# Patient Record
Sex: Male | Born: 1965 | Race: White | Hispanic: No | Marital: Married
Health system: Southern US, Community
[De-identification: ages and names within clinical notes are randomized; demographics above are authoritative.]

---

## 2014-09-14 ENCOUNTER — Ambulatory Visit: Payer: Self-pay | Admitting: Unknown Physician Specialty

## 2016-01-05 IMAGING — US ABDOMEN ULTRASOUND
1 series · 14 of 25 positions shown · non-contrast
Comparison: None.

CLINICAL DATA: Generalized abdominal pain and elevated LFTs.

EXAM:
ULTRASOUND ABDOMEN COMPLETE

[Series 1: abdomen ultrasound · 0.23mm/px · 14 of 88 slices shown]
[im 1/88]
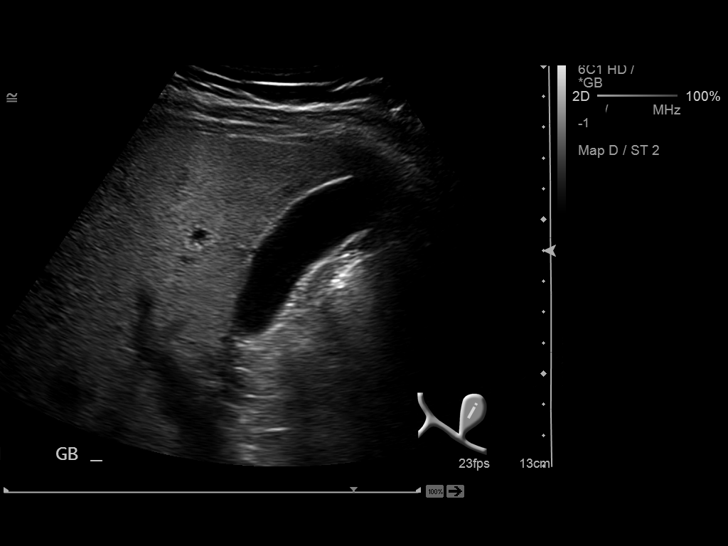
[im 8/88]
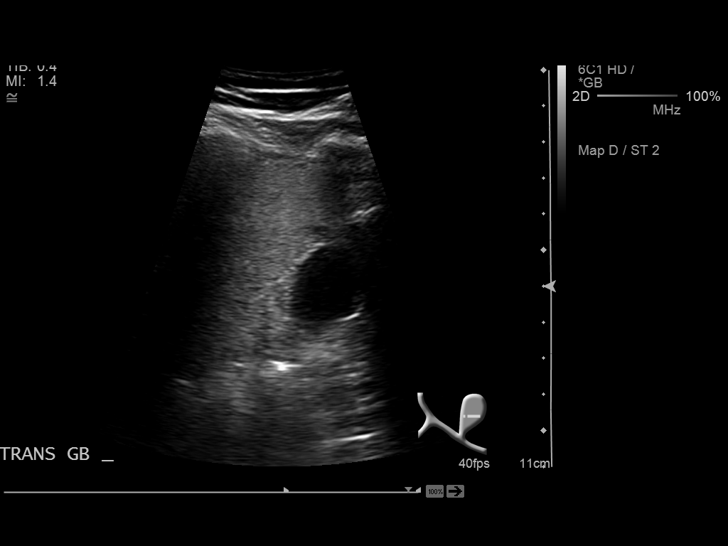
[im 15/88]
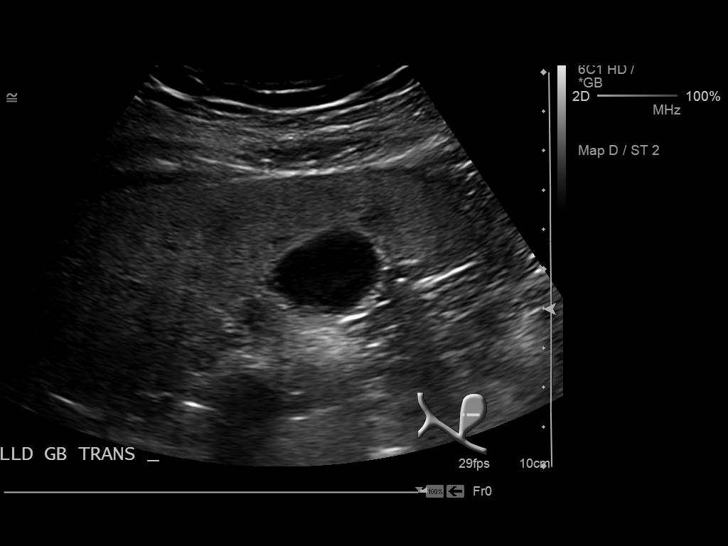
[im 22/88]
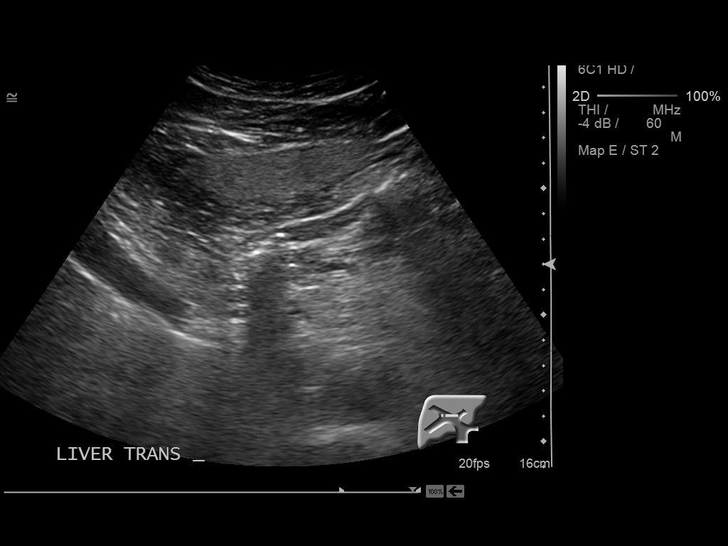
[im 30/88]
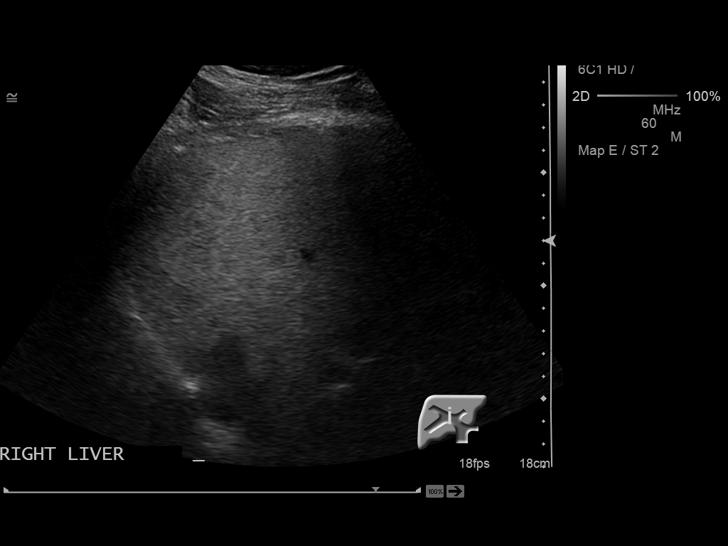
[im 33/88]
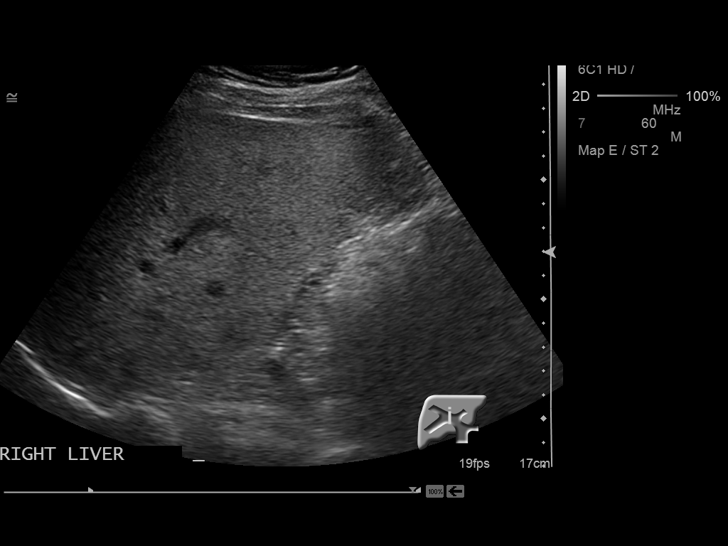
[im 40/88]
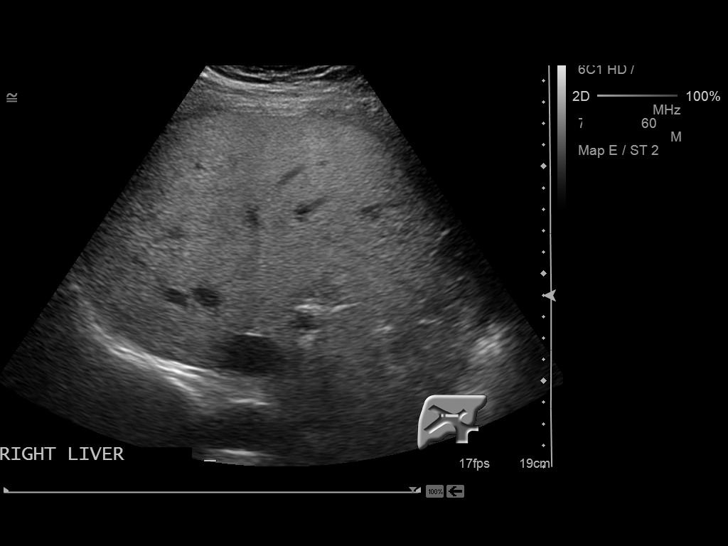
[im 48/88]
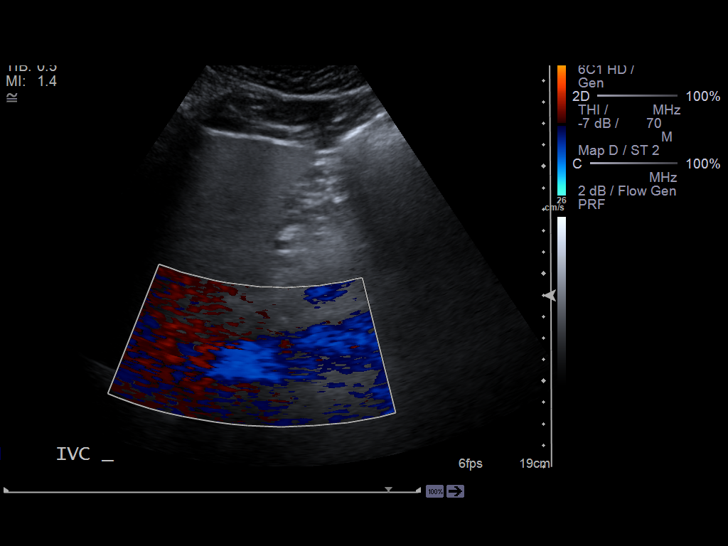
[im 55/88]
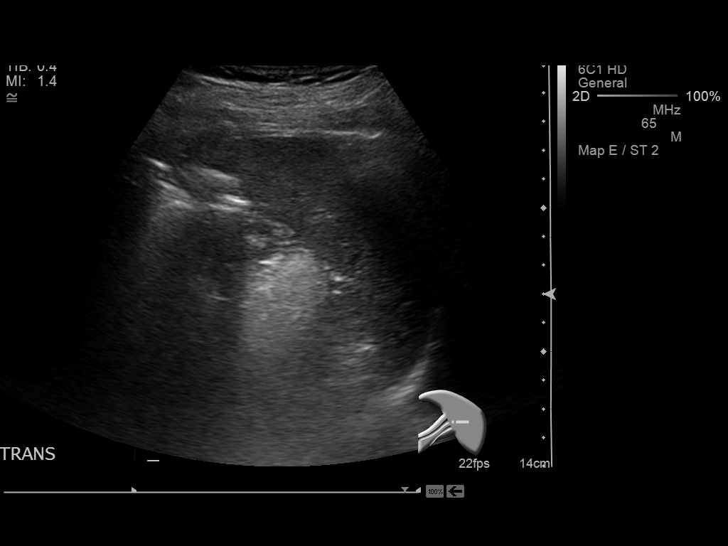
[im 59/88]
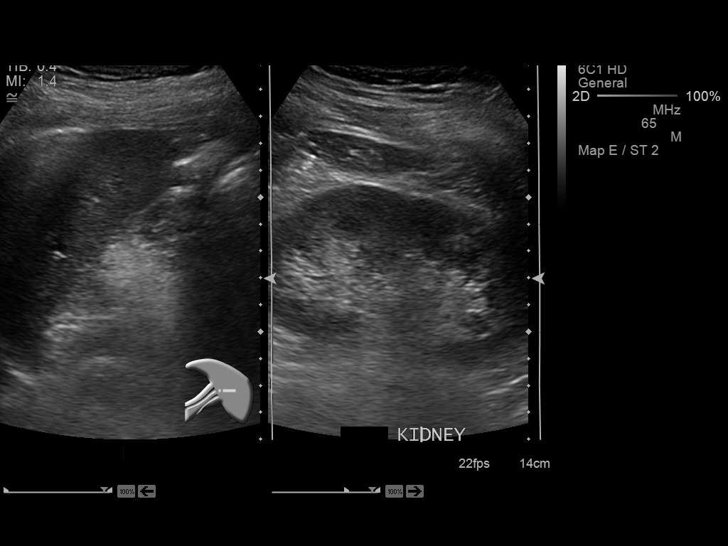
[im 66/88]
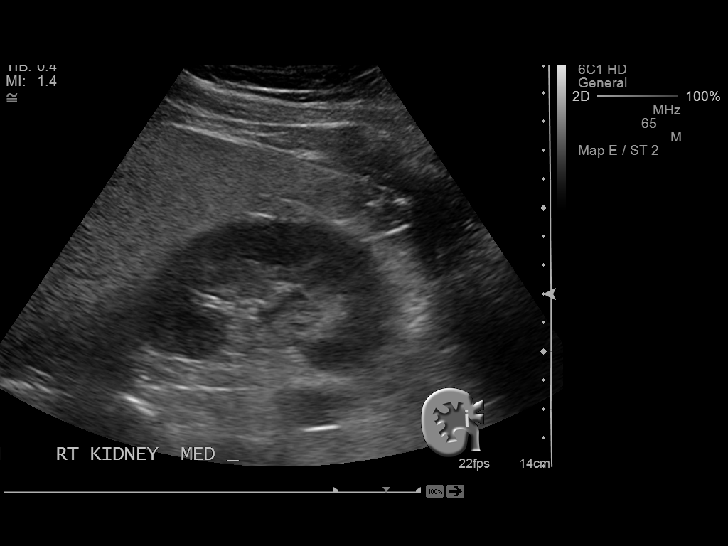
[im 73/88]
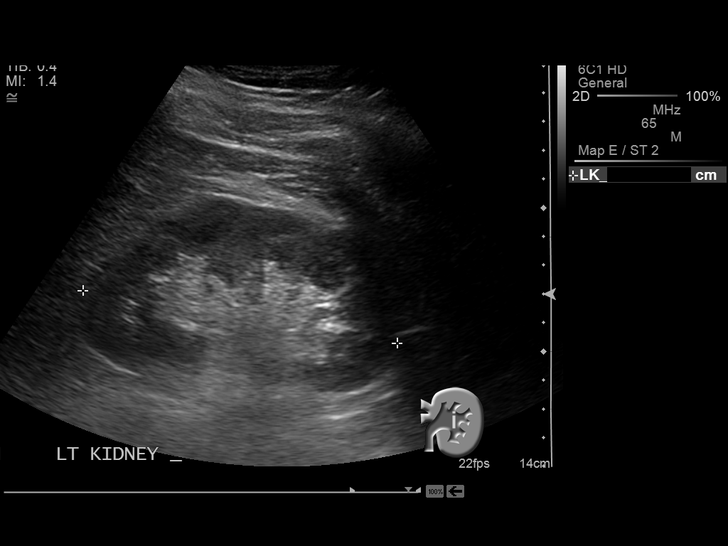
[im 80/88]
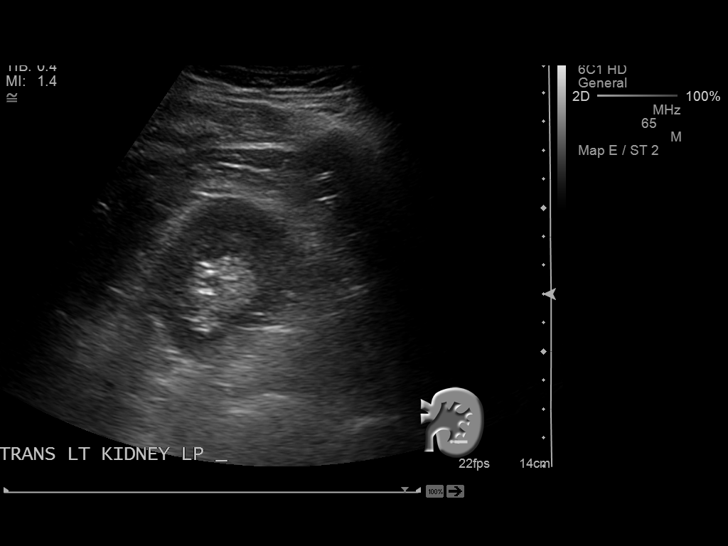
[im 88/88]
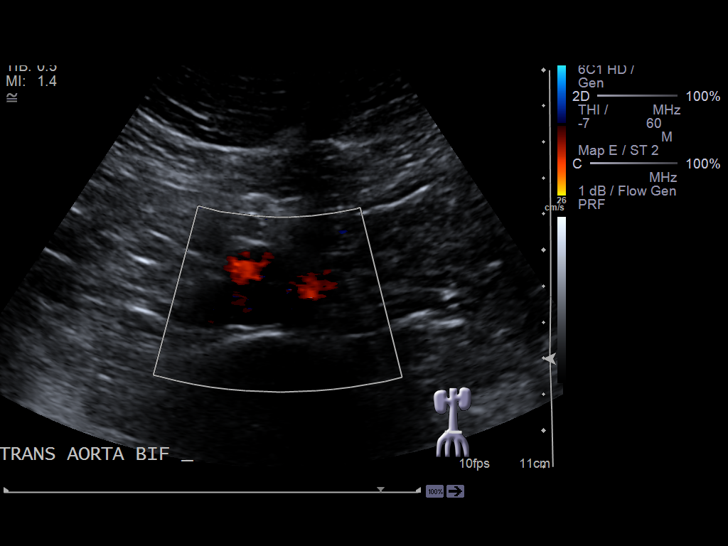

[14 of 25 positions shown; findings below may reference images not displayed]

FINDINGS: Gallbladder: Gallbladder has a normal appearance. Gallbladder wall
is 2.3 mm, within normal limits. No stones or pericholecystic fluid.
No sonographic Murphy's sign.

Common bile duct: Diameter: 3.4 mm

Liver: The liver is echogenic. There is attenuation of the
ultrasound wave, poor visualization of the internal hepatic
architecture, and loss of definition of the diaphragm. No focal
lesions identified.

IVC: No abnormality visualized.

Pancreas: Visualized portion unremarkable.

Spleen: Size and appearance within normal limits.

Right Kidney: Length: 11.1 cm. Echogenicity within normal limits. No
mass or hydronephrosis visualized.

Left Kidney: Length: 11.1 cm. Echogenicity within normal limits. No
mass or hydronephrosis visualized.

Abdominal aorta: No aneurysm visualized.

Other findings: None.
IMPRESSION: 1. Fatty liver.
2.  No evidence for acute  abnormality.

## 2019-03-29 ENCOUNTER — Other Ambulatory Visit: Payer: Self-pay

## 2019-03-29 DIAGNOSIS — Z20822 Contact with and (suspected) exposure to covid-19: Secondary | ICD-10-CM

## 2019-03-30 ENCOUNTER — Ambulatory Visit: Payer: Self-pay | Admitting: *Deleted

## 2019-03-30 LAB — NOVEL CORONAVIRUS, NAA: SARS-CoV-2, NAA: DETECTED — AB

## 2019-03-30 NOTE — Telephone Encounter (Signed)
Contacted pt regarding COVID results; the states that he just spoke with another nurse regarding this issue.  Reason for Disposition . Caller has already spoken with another triager and has no further questions.  Answer Assessment - Initial Assessment Questions 1. REASON FOR CALL or QUESTION: "What is your reason for calling today?" or "How can I best help you?" or "What question do you have that I can help answer?"  Protocols used: NO CONTACT OR DUPLICATE CONTACT CALL-A-AH, INFORMATION ONLY CALL - NO TRIAGE-A-AH

## 2019-06-16 ENCOUNTER — Other Ambulatory Visit: Payer: Self-pay | Admitting: General Surgery

## 2019-06-16 DIAGNOSIS — R1031 Right lower quadrant pain: Secondary | ICD-10-CM

## 2019-06-29 ENCOUNTER — Ambulatory Visit
Admission: RE | Admit: 2019-06-29 | Discharge: 2019-06-29 | Disposition: A | Discharge status: Home / Self Care | Payer: Self-pay | Source: Ambulatory Visit | Attending: General Surgery | Admitting: General Surgery

## 2019-06-29 ENCOUNTER — Other Ambulatory Visit: Payer: Self-pay

## 2019-06-29 DIAGNOSIS — R1031 Right lower quadrant pain: Secondary | ICD-10-CM | POA: Insufficient documentation

## 2019-10-31 ENCOUNTER — Ambulatory Visit: Payer: PRIVATE HEALTH INSURANCE | Attending: Internal Medicine

## 2019-10-31 DIAGNOSIS — Z23 Encounter for immunization: Secondary | ICD-10-CM

## 2019-10-31 NOTE — Progress Notes (Signed)
   Covid-19 Vaccination Clinic  Name:  Randall Castillo    MRN: [id number] DOB: February 27, 1966  10/31/2019  Mr. [name] was observed post Covid-19 immunization for 15 minutes without incident. He was provided with Vaccine Information Sheet and instruction to access the V-Safe system.   Mr. [name] was instructed to call 911 with any severe reactions post vaccine: Marland Kitchen Difficulty breathing  . Swelling of face and throat  . A fast heartbeat  . A bad rash all over body  . Dizziness and weakness   Immunizations Administered    Name Date Dose VIS Date Route   Pfizer COVID-19 Vaccine 10/31/2019 11:25 AM 0.3 mL 07/08/2019 Intramuscular   Manufacturer: [employer], [employer]   Lot: [phone] [id number]   NDC: [address]-1

## 2019-11-22 ENCOUNTER — Ambulatory Visit: Payer: PRIVATE HEALTH INSURANCE | Attending: Internal Medicine

## 2019-11-22 DIAGNOSIS — Z23 Encounter for immunization: Secondary | ICD-10-CM

## 2019-11-22 NOTE — Progress Notes (Signed)
   Covid-19 Vaccination Clinic  Name:  Randall Castillo    MRN: [id number] DOB: 06/28/1966  11/22/2019  [name] was observed post Covid-19 immunization for 15 minutes without incident. He was provided with Vaccine Information Sheet and instruction to access the V-Safe system.   [name] was instructed to call 911 with any severe reactions post vaccine: Marland Kitchen Difficulty breathing  . Swelling of face and throat  . A fast heartbeat  . A bad rash all over body  . Dizziness and weakness   Immunizations Administered    Name Date Dose VIS Date Route   Pfizer COVID-19 Vaccine 11/22/2019  1:50 PM 0.3 mL 09/21/2018 Intramuscular   Manufacturer: [employer], [employer]   Lot: [id number]   NDC: [address]-0

## 2020-10-19 IMAGING — CT CT PELVIS W/O CM
2 of 3 series · 17 of 46 positions shown, 19 images · non-contrast
Comparison: Abdominal ultrasound dated 09/14/2014.

CLINICAL DATA: 53-year-old male with right groin pain.

EXAM:
CT PELVIS WITHOUT CONTRAST
TECHNIQUE: Multidetector CT imaging of the pelvis was performed following the
standard protocol without intravenous contrast.

[Series 2: axials routine abdomen pelvis without 5.00 · axial · non-contrast · 0.72mm/px · z∈[-1557,-1307]mm · 14 of 58 slices shown, 16 images]
[im 4/58  soft-tissue]
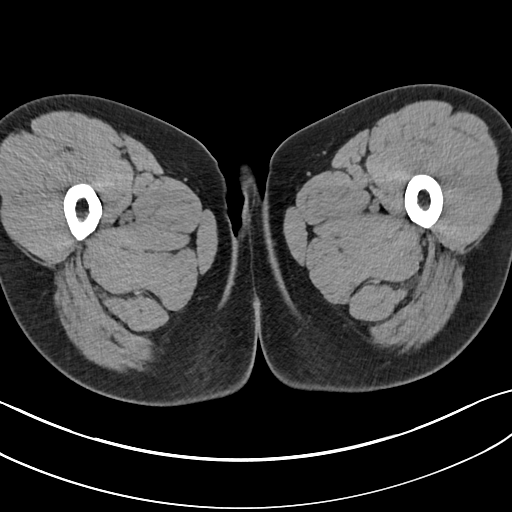
[im 4/58  bone]
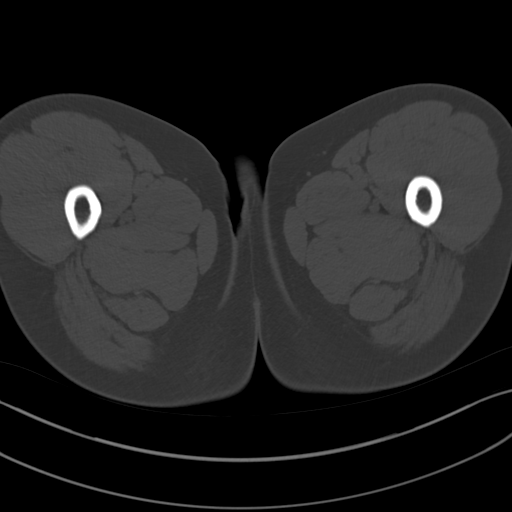
[im 8/58  soft-tissue]
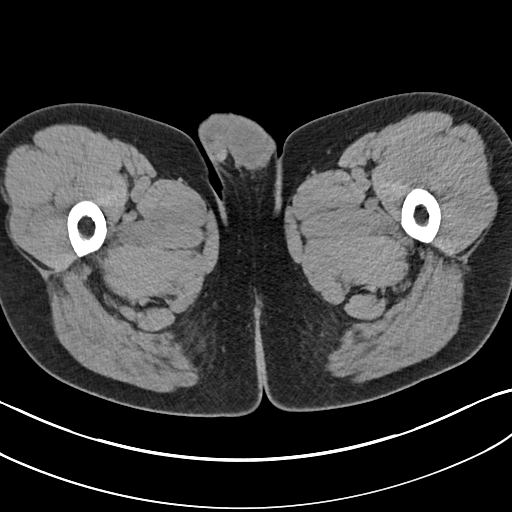
[im 12/58  soft-tissue]
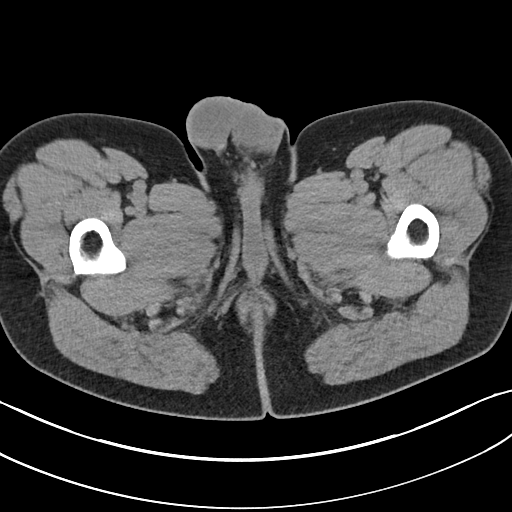
[im 15/58  soft-tissue]
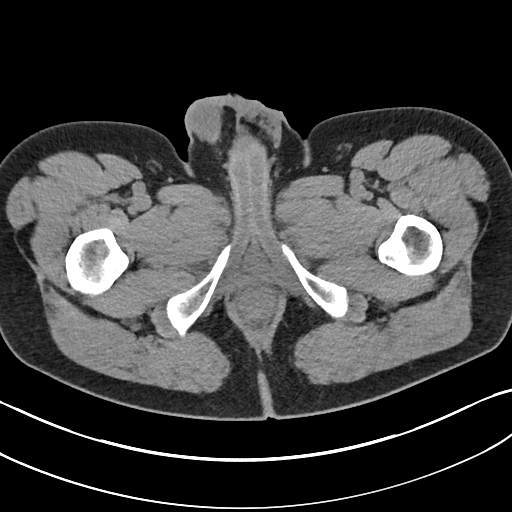
[im 19/58  soft-tissue]
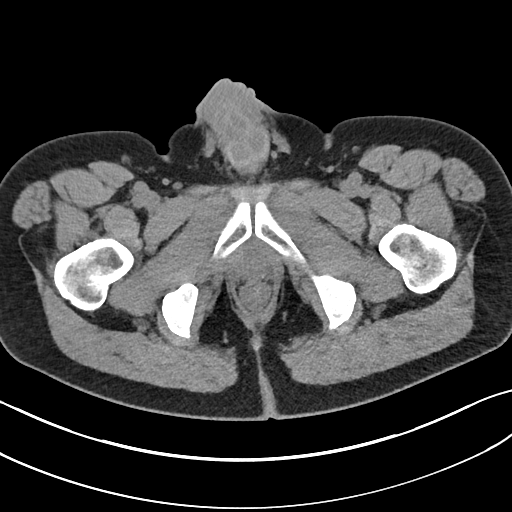
[im 23/58  soft-tissue]
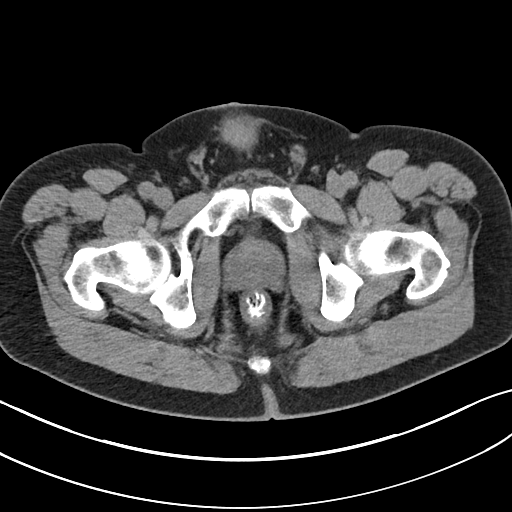
[im 26/58  soft-tissue]
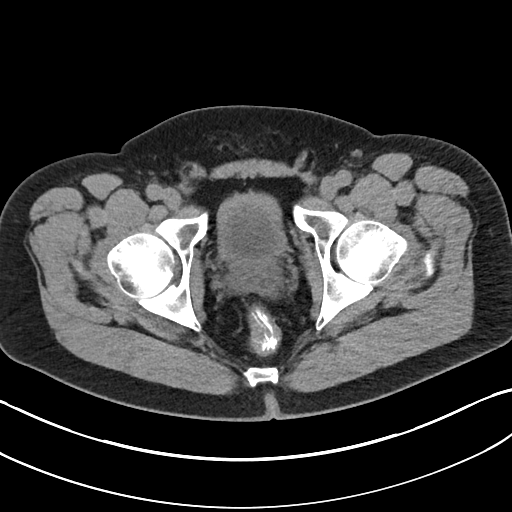
[im 32/58  soft-tissue]
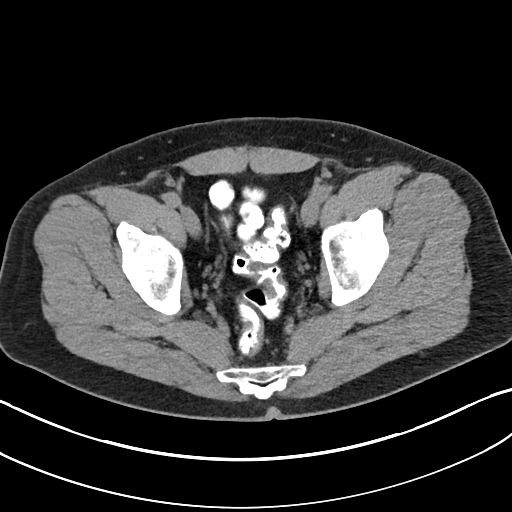
[im 35/58  soft-tissue]
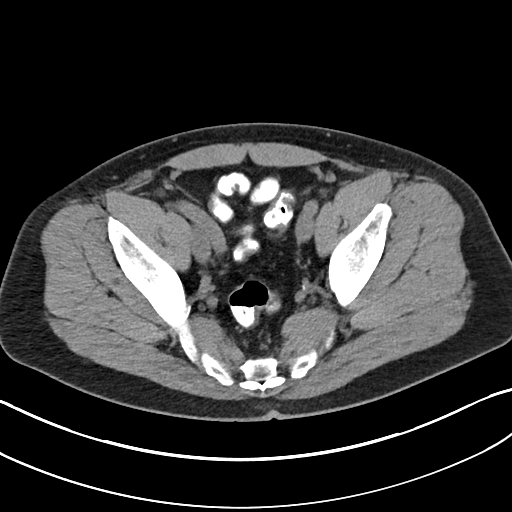
[im 35/58  bone]
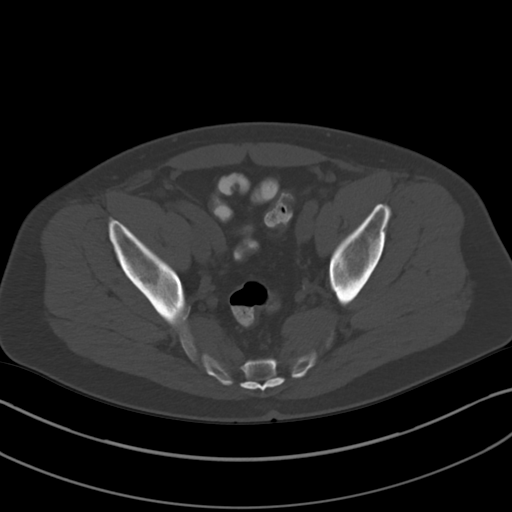
[im 39/58  soft-tissue]
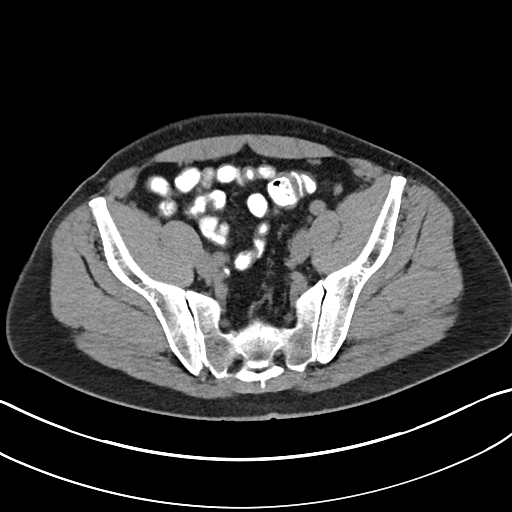
[im 43/58  soft-tissue]
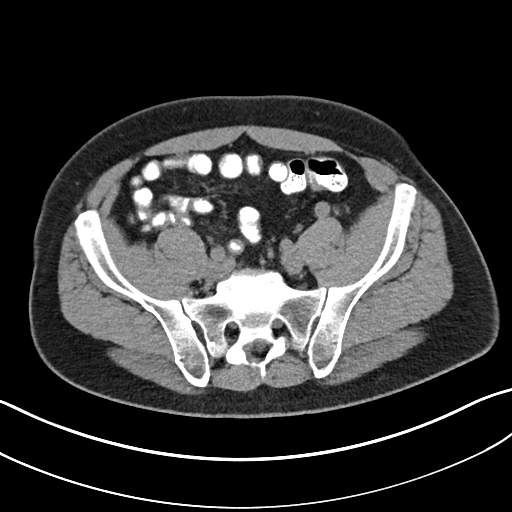
[im 46/58  soft-tissue]
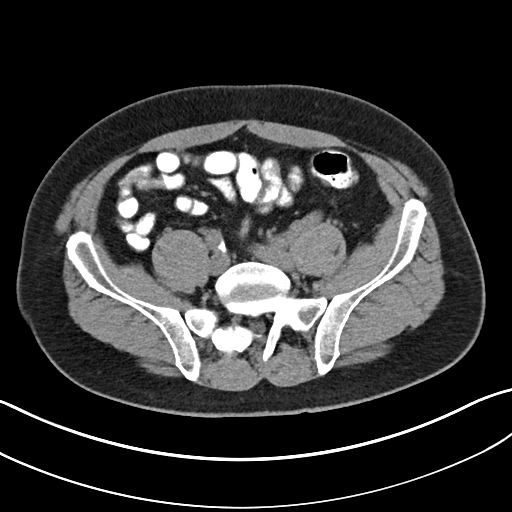
[im 50/58  soft-tissue]
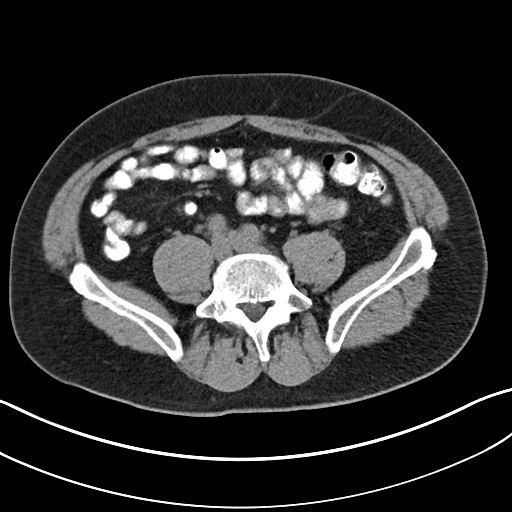
[im 54/58  soft-tissue]
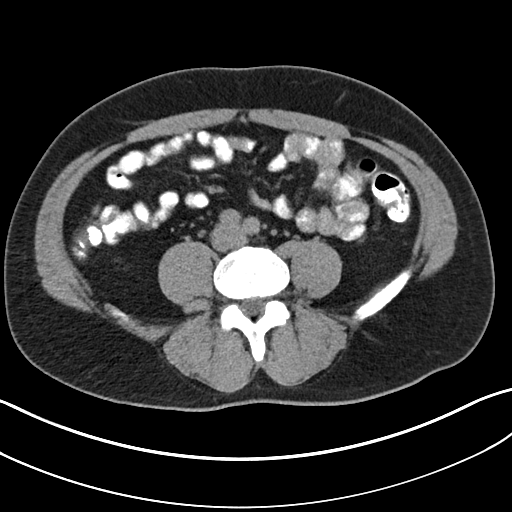

[Series 3: coronals routine abdomen pelvis without 2.00 cor · coronal · non-contrast · 0.57mm/px · 3 of 130 slices shown]
[im 44/130  soft-tissue]
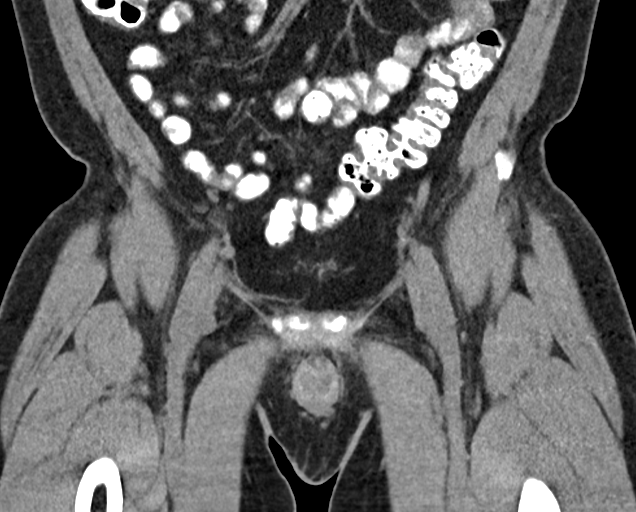
[im 58/130  soft-tissue]
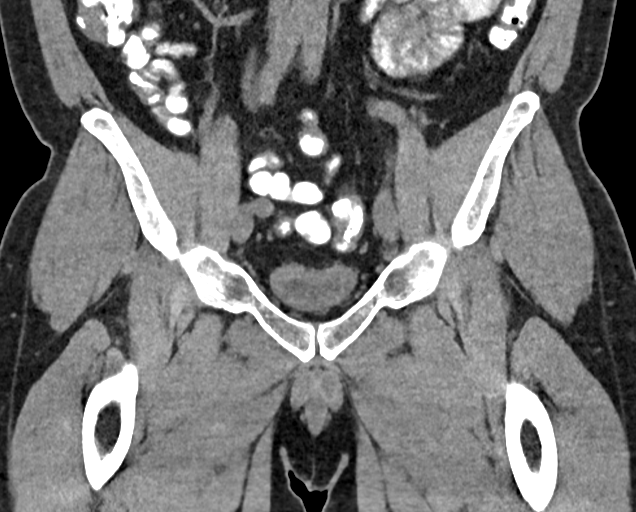
[im 72/130  soft-tissue]
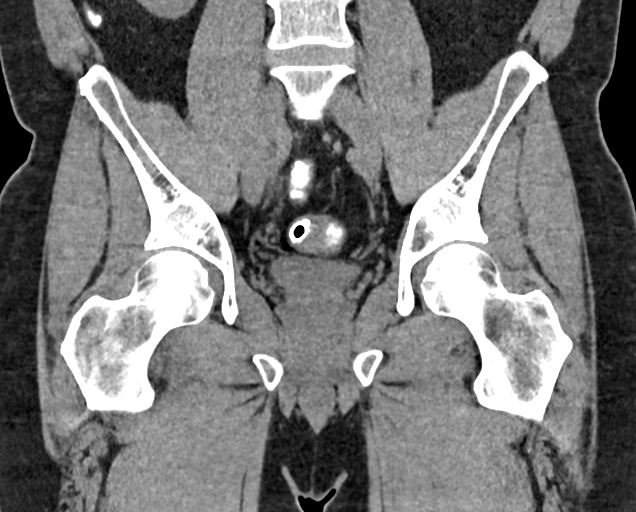

[17 of 46 positions shown; findings below may reference images not displayed]

FINDINGS: Urinary Tract: The visualized ureters appear unremarkable. The
urinary bladder is collapsed.

Bowel: No bowel dilatation. The appendix is partially visualized.
The visualized portion of the appendix appears unremarkable.

Vascular/Lymphatic: Mild aortoiliac atherosclerotic disease. No
adenopathy.

Reproductive: The prostate and seminal vesicles are grossly
unremarkable.

Other:  None

Musculoskeletal: No suspicious bone lesions identified.
IMPRESSION: No acute pelvic pathology.

## 2022-10-24 ENCOUNTER — Other Ambulatory Visit: Payer: Self-pay

## 2022-10-24 ENCOUNTER — Emergency Department
Admission: EM | Admit: 2022-10-24 | Discharge: 2022-10-24 | Disposition: A | Discharge status: Home / Self Care | Payer: PRIVATE HEALTH INSURANCE | Attending: Emergency Medicine | Admitting: Emergency Medicine

## 2022-10-24 ENCOUNTER — Emergency Department: Payer: PRIVATE HEALTH INSURANCE

## 2022-10-24 DIAGNOSIS — W540XXA Bitten by dog, initial encounter: Secondary | ICD-10-CM | POA: Diagnosis not present

## 2022-10-24 DIAGNOSIS — S81832A Puncture wound without foreign body, left lower leg, initial encounter: Secondary | ICD-10-CM | POA: Insufficient documentation

## 2022-10-24 DIAGNOSIS — S81831A Puncture wound without foreign body, right lower leg, initial encounter: Secondary | ICD-10-CM | POA: Diagnosis present

## 2022-10-24 MED ORDER — DOXYCYCLINE HYCLATE 100 MG PO TABS: 100.0000 mg | ORAL_TABLET | Freq: Two times a day (BID) | ORAL | 0 refills | Status: DC

## 2022-10-24 MED ORDER — DOXYCYCLINE HYCLATE 100 MG PO TABS: 100.0000 mg | ORAL_TABLET | Freq: Two times a day (BID) | ORAL | 0 refills | Status: AC

## 2022-10-24 MED ORDER — DOXYCYCLINE HYCLATE 100 MG PO TABS
100.0000 mg | ORAL_TABLET | Freq: Once | ORAL | Status: AC
  Administered 2022-10-24: 100 mg via ORAL
  Filled 2022-10-24: qty 1

## 2022-10-24 NOTE — ED Triage Notes (Signed)
Pt to ED for dog bite to posterior left leg and anterior right leg. States was bit by 4 dogs at friends house.   Pt address [address] [place] [address]  Pt phone number [phone]  Dog owner :Alverda Skeans [id number] [place]  [phone]  This RN contacted Communications to report to animal control

## 2022-10-24 NOTE — ED Notes (Signed)
This RN spoke with bryce from [place] office. Stated to give pt communications phone number and have him call when he is discharged. This RN gave pt phone number

## 2022-10-24 NOTE — ED Notes (Signed)
Pt was bite in the back of left calf by [place] sheppard. Pt knows owner of dog. Animal Control has been notified.

## 2022-10-24 NOTE — ED Provider Notes (Signed)
[facility] Provider Note  Patient Contact: 8:15 PM (approximate)   History   Animal Bite   HPI  Randall Castillo is a 57 y.o. male who presents the emergency department complaining of dog bite to both of his lower legs.  Patient's main bite is to the left calf, he does have 3 puncture wounds to the right anterior shin as well.  Patient states that he was at a [place] gathering when 4 dogs bit him.  Patient is friends with the owners, believes that the animals are up-to-date on immunizations.  Patient is up-to-date on his tetanus shot.  Bleeding was controlled prior to arrival.  Patient thoroughly cleansed the wounds prior to arrival.     Physical Exam   Triage Vital Signs: ED Triage Vitals  Enc Vitals Group     BP 10/24/22 1814 (!) 167/107     Pulse Rate 10/24/22 1814 (!) 118     Resp 10/24/22 1814 18     Temp 10/24/22 1814 98.2 F (36.8 C)     Temp src --      SpO2 10/24/22 1814 94 %     Weight 10/24/22 1815 213 lb (96.6 kg)     Height 10/24/22 1815 5\' 8"  (1.727 m)     Head Circumference --      Peak Flow --      Pain Score 10/24/22 1815 5     Pain Loc --      Pain Edu? --      Excl. in [place]? --     Most recent vital signs: Vitals:   10/24/22 1814  BP: (!) 167/107  Pulse: (!) 118  Resp: 18  Temp: 98.2 F (36.8 C)  SpO2: 94%     General: Alert and in no acute distress.  Cardiovascular:  Good peripheral perfusion Respiratory: Normal respiratory effort without tachypnea or retractions. Lungs CTAB.  Musculoskeletal: Full range of motion to all extremities.  Visualization of the bilateral lower extremities reveals wounds consistent with dog bites to both lower legs.  Bite to the left calf reveals multiple puncture wounds.  Dried blood is identified without active bleeding.  No visible foreign body.  Full range of motion to the foot and ankle.  Examination of the right leg reveals 3 puncture wounds to the anterior shin consistent with dog  bite.  No evidence of retained foreign body.  Full range of motion to the ankle and foot. Neurologic:  No gross focal neurologic deficits are appreciated.  Skin:   No rash noted Other:   ED Results / Procedures / Treatments   Labs (all labs ordered are listed, but only abnormal results are displayed) Labs Reviewed - No data to display   EKG     RADIOLOGY  I personally viewed, evaluated, and interpreted these images as part of my medical decision making, as well as reviewing the written report by the radiologist.  ED Provider Interpretation: No acute osseous trauma.  No radiopaque foreign body.  DG Tibia/Fibula Left  Result Date: 10/24/2022 CLINICAL DATA:  Dog bite EXAM: LEFT TIBIA AND FIBULA - 2 VIEW COMPARISON:  None Available. FINDINGS: No fracture or dislocation is seen. There are no demonstrable puncture wounds in the bony structures. There are no opaque foreign bodies. There is edema in subcutaneous plane. IMPRESSION: No radiographic abnormalities are seen in bony structures. Electronically Signed   By: Elmer Picker M.D.   On: 10/24/2022 20:31   DG Tibia/Fibula Right  Result Date:  10/24/2022 CLINICAL DATA:  Dog bite EXAM: RIGHT TIBIA AND FIBULA - 2 VIEW COMPARISON:  None Available. FINDINGS: No fracture or dislocation is seen. There are no opaque foreign bodies. There is subcutaneous edema. IMPRESSION: No radiographic abnormalities are seen in bony structures. There are no opaque foreign bodies. Electronically Signed   By: Elmer Picker M.D.   On: 10/24/2022 20:31    PROCEDURES:  Critical Care performed: No  Procedures   MEDICATIONS ORDERED IN ED: Medications  doxycycline (VIBRA-TABS) tablet 100 mg (has no administration in time range)     IMPRESSION / MDM / ASSESSMENT AND PLAN / ED COURSE  I reviewed the triage vital signs and the nursing notes.                                 Differential diagnosis includes, but is not limited to, dog bite,  osseous injury, retained foreign body  Patient's presentation is most consistent with acute presentation with potential threat to life or bodily function.   Patient's diagnosis is consistent with Elimite.  Patient presents emergency department send dog bites to both lower extremities.  Patient has puncture wounds to both sides without frank lacerations.  No evidence of retained foreign body on physical exam or x-ray.  There is no underlying osseous trauma on x-ray.  Patient is up-to-date on tetanus immunization.  Patient declines rabies vaccination series at this time which I feel is reasonable.  Patient is informed that he may return if he changes his mind about rabies vaccination.  Patient will be placed on antibiotics prophylactically.  Patient is placed on doxycycline as he is allergic to amoxicillin.. Patient is given ED precautions to return to the ED for any worsening or new symptoms.     FINAL CLINICAL IMPRESSION(S) / ED DIAGNOSES   Final diagnoses:  Dog bite, initial encounter     Rx / DC Orders   ED Discharge Orders          Ordered    doxycycline (VIBRA-TABS) 100 MG tablet  2 times daily        10/24/22 2141             Note:  This document was prepared using Dragon voice recognition software and may include unintentional dictation errors.   Brynda Peon 10/24/22 2142    Rada Hay, MD 10/25/22 2328

## 2024-03-04 ENCOUNTER — Other Ambulatory Visit: Payer: Self-pay | Admitting: Internal Medicine

## 2024-03-04 DIAGNOSIS — R7989 Other specified abnormal findings of blood chemistry: Secondary | ICD-10-CM
